# Patient Record
Sex: Female | Born: 1995 | Race: Black or African American | Hispanic: No
Health system: Southern US, Community
[De-identification: ages and names within clinical notes are randomized; demographics above are authoritative.]

---
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
## 2019-10-16 ENCOUNTER — Other Ambulatory Visit (HOSPITAL_COMMUNITY)
Admission: RE | Admit: 2019-10-16 | Discharge: 2019-10-16 | Disposition: A | Discharge status: Home / Self Care | Payer: BC Managed Care – PPO | Source: Ambulatory Visit | Attending: Family Medicine | Admitting: Family Medicine

## 2019-10-16 ENCOUNTER — Other Ambulatory Visit: Payer: Self-pay | Admitting: Family Medicine

## 2019-10-16 DIAGNOSIS — Z124 Encounter for screening for malignant neoplasm of cervix: Secondary | ICD-10-CM | POA: Insufficient documentation

## 2019-10-21 LAB — CYTOLOGY - PAP: Diagnosis: NEGATIVE

## 2021-01-31 DIAGNOSIS — Z03818 Encounter for observation for suspected exposure to other biological agents ruled out: Secondary | ICD-10-CM | POA: Diagnosis not present

## 2021-01-31 DIAGNOSIS — Z20822 Contact with and (suspected) exposure to covid-19: Secondary | ICD-10-CM | POA: Diagnosis not present

## 2021-02-09 DIAGNOSIS — Z20822 Contact with and (suspected) exposure to covid-19: Secondary | ICD-10-CM | POA: Diagnosis not present

## 2021-02-09 DIAGNOSIS — Z03818 Encounter for observation for suspected exposure to other biological agents ruled out: Secondary | ICD-10-CM | POA: Diagnosis not present

## 2021-02-10 ENCOUNTER — Telehealth: Payer: BC Managed Care – PPO | Admitting: Physician Assistant

## 2021-02-10 DIAGNOSIS — H1032 Unspecified acute conjunctivitis, left eye: Secondary | ICD-10-CM | POA: Diagnosis not present

## 2021-02-10 DIAGNOSIS — J020 Streptococcal pharyngitis: Secondary | ICD-10-CM | POA: Diagnosis not present

## 2021-02-10 MED ORDER — LIDOCAINE VISCOUS HCL 2 % MT SOLN: OROMUCOSAL | 0 refills | Status: AC

## 2021-02-10 MED ORDER — AMOXICILLIN 500 MG PO CAPS: 500.0000 mg | ORAL_CAPSULE | Freq: Two times a day (BID) | ORAL | 0 refills | Status: AC

## 2021-02-10 MED ORDER — POLYMYXIN B-TRIMETHOPRIM 10000-0.1 UNIT/ML-% OP SOLN: 1.0000 [drp] | OPHTHALMIC | 0 refills | Status: AC

## 2021-02-10 NOTE — Patient Instructions (Signed)
Janet Escobar, thank you for joining Margaretann Loveless, PA-C for today's virtual visit.  While this provider is not your primary care provider (PCP), if your PCP is located in our provider database this encounter information will be shared with them immediately following your visit.  Consent: (Patient) Janet Escobar provided verbal consent for this virtual visit at the beginning of the encounter.  Current Medications:  Current Outpatient Medications:    amoxicillin (AMOXIL) 500 MG capsule, Take 1 capsule (500 mg total) by mouth 2 (two) times daily for 10 days., Disp: 20 capsule, Rfl: 0   lidocaine (XYLOCAINE) 2 % solution, 65mL swish and swallow every 4 hours as needed for sore throat, Disp: 100 mL, Rfl: 0   trimethoprim-polymyxin b (POLYTRIM) ophthalmic solution, Place 1 drop into the left eye every 4 (four) hours. X 3-5 days, Disp: 10 mL, Rfl: 0   Medications ordered in this encounter:  Meds ordered this encounter  Medications   amoxicillin (AMOXIL) 500 MG capsule    Sig: Take 1 capsule (500 mg total) by mouth 2 (two) times daily for 10 days.    Dispense:  20 capsule    Refill:  0    Order Specific Question:   Supervising Provider    Answer:   MILLER, BRIAN [[id number]]   lidocaine (XYLOCAINE) 2 % solution    Sig: 86mL swish and swallow every 4 hours as needed for sore throat    Dispense:  100 mL    Refill:  0    Order Specific Question:   Supervising Provider    Answer:   Hyacinth Meeker, BRIAN [[id number]]   trimethoprim-polymyxin b (POLYTRIM) ophthalmic solution    Sig: Place 1 drop into the left eye every 4 (four) hours. X 3-5 days    Dispense:  10 mL    Refill:  0    Order Specific Question:   Supervising Provider    Answer:   Hyacinth Meeker, BRIAN [[id number]]     *If you need refills on other medications prior to your next appointment, please contact your pharmacy*  Follow-Up: Call back or seek an in-person evaluation if the symptoms worsen or if the condition fails to improve as  anticipated.  Other Instructions Strep Throat, Adult Strep throat is an infection in the throat that is caused by bacteria. It is common during the cold months of the year. It mostly affects children who are 14-55 years old. However, people of all ages can get it at any time of the year. This infection spreads from person to person (is contagious) through coughing, sneezing, or having close contact. Your health care provider may use other names to describe the infection. When strep throat affects the tonsils, it is called tonsillitis. When it affects the back of the throat, it is called pharyngitis. What are the causes? This condition is caused by the Streptococcus pyogenes bacteria. What increases the risk? You are more likely to develop this condition if: You care for school-age children, or are around school-age children. Children are more likely to get strep throat and may spread it to others. You spend time in crowded places where the infection can spread easily. You have close contact with someone who has strep throat. What are the signs or symptoms? Symptoms of this condition include: Fever or chills. Redness, swelling, or pain in the tonsils or throat. Pain or difficulty when swallowing. White or yellow spots on the tonsils or throat. Tender glands in the neck and under the jaw. Bad smelling breath. Red  rash all over the body. This is rare. How is this diagnosed? This condition is diagnosed by tests that check for the presence and the amount of bacteria that cause strep throat. They are: Rapid strep test. Your throat is swabbed and checked for the presence of bacteria. Results are usually ready in minutes. Throat culture test. Your throat is swabbed. The sample is placed in a cup that allows infections to grow. Results are usually ready in 1 or 2 days. How is this treated? This condition may be treated with: Medicines that kill germs (antibiotics). Medicines that relieve pain or  fever. These include: Ibuprofen or acetaminophen. Aspirin, only for people who are over the age of 24. Throat lozenges. Throat sprays. Follow these instructions at home: Medicines  Take over-the-counter and prescription medicines only as told by your health care provider. Take your antibiotic medicine as told by your health care provider. Do not stop taking the antibiotic even if you start to feel better. Eating and drinking  If you have trouble swallowing, try eating soft foods until your sore throat feels better. Drink enough fluid to keep your urine pale yellow. To help relieve pain, you may have: Warm fluids, such as soup and tea. Cold fluids, such as frozen desserts or popsicles. General instructions Gargle with a salt-water mixture 3-4 times a day or as needed. To make a salt-water mixture, completely dissolve -1 tsp (3-6 g) of salt in 1 cup (237 mL) of warm water. Get plenty of rest. Stay home from work or school until you have been taking antibiotics for 24 hours. Do not use any products that contain nicotine or tobacco. These products include cigarettes, chewing tobacco, and vaping devices, such as e-cigarettes. If you need help quitting, ask your health care provider. It is up to you to get your test results. Ask your health care provider, or the department that is doing the test, when your results will be ready. Keep all follow-up visits. This is important. How is this prevented?  Do not share food, drinking cups, or personal items that could cause the infection to spread to other people. Wash your hands often with soap and water for at least 20 seconds. If soap and water are not available, use hand sanitizer. Make sure that all people in your house wash their hands well. Have family members tested if they have a sore throat or fever. They may need an antibiotic if they have strep throat. Contact a health care provider if: You have swelling in your neck that keeps getting  bigger. You develop a rash, cough, or earache. You cough up a thick mucus that is green, yellow-brown, or bloody. You have pain or discomfort that does not get better with medicine. Your symptoms seem to be getting worse. You have a fever. Get help right away if: You have new symptoms, such as vomiting, severe headache, stiff or painful neck, chest pain, or shortness of breath. You have severe throat pain, drooling, or changes in your voice. You have swelling of the neck, or the skin on the neck becomes red and tender. You have signs of dehydration, such as tiredness (fatigue), dry mouth, and decreased urination. You become increasingly sleepy, or you cannot wake up completely. Your joints become red or painful. These symptoms may represent a serious problem that is an emergency. Do not wait to see if the symptoms will go away. Get medical help right away. Call your local emergency services (911 in the [place] Do not drive  yourself to the hospital. Summary Strep throat is an infection in the throat that is caused by the Streptococcus pyogenes bacteria. This infection is spread from person to person (is contagious) through coughing, sneezing, or having close contact. Take your medicines, including antibiotics, as told by your health care provider. Do not stop taking the antibiotic even if you start to feel better. To prevent the spread of germs, wash your hands well with soap and water. Have others do the same. Do not share food, drinking cups, or personal items. Get help right away if you have new symptoms, such as vomiting, severe headache, stiff or painful neck, chest pain, or shortness of breath. This information is not intended to replace advice given to you by your health care provider. Make sure you discuss any questions you have with your health care provider. Document Revised: 05/10/2020 Document Reviewed: 05/10/2020 Elsevier Patient Education  2022 [employer].    If you have been  instructed to have an in-person evaluation today at a local Urgent Care facility, please use the link below. It will take you to a list of all of our available [facility], including address, phone number and hours of operation. Please do not delay care.  [facility]  If you or a family member do not have a primary care provider, use the link below to schedule a visit and establish care. When you choose a [facility] primary care physician or advanced practice provider, you gain a long-term partner in health. Find a Primary Care Provider  Learn more about [facility] in-office and virtual care options: [facility] - Get Care Now

## 2021-02-10 NOTE — Progress Notes (Signed)
Virtual Visit Consent   Janet Escobar, you are scheduled for a virtual visit with a [facility] provider today.     Just as with appointments in the office, your consent must be obtained to participate.  Your consent will be active for this visit and any virtual visit you may have with one of our providers in the next 365 days.     If you have a MyChart account, a copy of this consent can be sent to you electronically.  All virtual visits are billed to your insurance company just like a traditional visit in the office.    As this is a virtual visit, video technology does not allow for your provider to perform a traditional examination.  This may limit your provider's ability to fully assess your condition.  If your provider identifies any concerns that need to be evaluated in person or the need to arrange testing (such as labs, EKG, etc.), we will make arrangements to do so.     Although advances in technology are sophisticated, we cannot ensure that it will always work on either your end or our end.  If the connection with a video visit is poor, the visit may have to be switched to a telephone visit.  With either a video or telephone visit, we are not always able to ensure that we have a secure connection.     I need to obtain your verbal consent now.   Are you willing to proceed with your visit today?    Janet Escobar has provided verbal consent on 02/10/2021 for a virtual visit (video or telephone).   Margaretann Loveless, PA-C   Date: 02/10/2021 9:51 AM   Virtual Visit via Video Note   I, Margaretann Loveless, connected with  Janet Escobar  ([id number], May 04, 1995) on 02/10/21 at  9:45 AM EST by a video-enabled telemedicine application and verified that I am speaking with the correct person using two identifiers.  Location: Patient: Virtual Visit Location Patient: Home Provider: Virtual Visit Location Provider: Home Office   I discussed the limitations of evaluation and management by  telemedicine and the availability of in person appointments. The patient expressed understanding and agreed to proceed.    History of Present Illness: Janet Escobar is a 26 y.o. who identifies as a female who was assigned female at birth, and is being seen today for sore throat.  HPI: Sore Throat  This is a new problem. Episode onset: Had simple URI that started on New Years that lasted about a week or more, was feeling better; traveled last week and returned Monday. Symptoms started Wednesday. The problem has been gradually worsening. There has been no fever. Associated symptoms include congestion, coughing, headaches, swollen glands and trouble swallowing (hurts to swallow; able to tolerate liquids). Pertinent negatives include no hoarse voice. Associated symptoms comments: Left eye pink and draining, loss of taste but is intermittent. She has had no exposure to strep or mono. She has tried acetaminophen and cool liquids for the symptoms. The treatment provided no relief.   Covid testing is negative  Problems: There are no problems to display for this patient.   Allergies: No Known Allergies Medications:  Current Outpatient Medications:    amoxicillin (AMOXIL) 500 MG capsule, Take 1 capsule (500 mg total) by mouth 2 (two) times daily for 10 days., Disp: 20 capsule, Rfl: 0   lidocaine (XYLOCAINE) 2 % solution, 21mL swish and swallow every 4 hours as needed for sore throat, Disp: 100 mL,  Rfl: 0   trimethoprim-polymyxin b (POLYTRIM) ophthalmic solution, Place 1 drop into the left eye every 4 (four) hours. X 3-5 days, Disp: 10 mL, Rfl: 0  Observations/Objective: Patient is well-developed, well-nourished in no acute distress.  Resting comfortably at home.  Head is normocephalic, atraumatic.  No labored breathing.  Speech is clear and coherent with logical content.  Patient is alert and oriented at baseline.    Assessment and Plan: 1. Strep pharyngitis - amoxicillin (AMOXIL) 500 MG capsule;  Take 1 capsule (500 mg total) by mouth 2 (two) times daily for 10 days.  Dispense: 20 capsule; Refill: 0 - lidocaine (XYLOCAINE) 2 % solution; 69mL swish and swallow every 4 hours as needed for sore throat  Dispense: 100 mL; Refill: 0  2. Acute bacterial conjunctivitis of left eye - trimethoprim-polymyxin b (POLYTRIM) ophthalmic solution; Place 1 drop into the left eye every 4 (four) hours. X 3-5 days  Dispense: 10 mL; Refill: 0  - Possible strep - Will treat with Amoxicillin and viscous lidocaine - Polytrim for conjunctivitis of the left eye - Warm liquids - Salt water gargles - Rest - Seek in person evaluation if symptoms fail to improve or worsen  Follow Up Instructions: I discussed the assessment and treatment plan with the patient. The patient was provided an opportunity to ask questions and all were answered. The patient agreed with the plan and demonstrated an understanding of the instructions.  A copy of instructions were sent to the patient via MyChart unless otherwise noted below.   The patient was advised to call back or seek an in-person evaluation if the symptoms worsen or if the condition fails to improve as anticipated.  Time:  I spent 12 minutes with the patient via telehealth technology discussing the above problems/concerns.    Margaretann Loveless, PA-C

## 2021-06-26 DIAGNOSIS — Z7251 High risk heterosexual behavior: Secondary | ICD-10-CM | POA: Diagnosis not present

## 2021-10-25 DIAGNOSIS — Z23 Encounter for immunization: Secondary | ICD-10-CM | POA: Diagnosis not present

## 2021-10-25 DIAGNOSIS — Z113 Encounter for screening for infections with a predominantly sexual mode of transmission: Secondary | ICD-10-CM | POA: Diagnosis not present

## 2021-10-25 DIAGNOSIS — Z131 Encounter for screening for diabetes mellitus: Secondary | ICD-10-CM | POA: Diagnosis not present

## 2021-10-25 DIAGNOSIS — Z13220 Encounter for screening for lipoid disorders: Secondary | ICD-10-CM | POA: Diagnosis not present

## 2021-10-25 DIAGNOSIS — J309 Allergic rhinitis, unspecified: Secondary | ICD-10-CM | POA: Diagnosis not present

## 2021-10-25 DIAGNOSIS — Z0000 Encounter for general adult medical examination without abnormal findings: Secondary | ICD-10-CM | POA: Diagnosis not present

## 2022-11-22 DIAGNOSIS — Z113 Encounter for screening for infections with a predominantly sexual mode of transmission: Secondary | ICD-10-CM | POA: Diagnosis not present

## 2022-11-22 DIAGNOSIS — Z23 Encounter for immunization: Secondary | ICD-10-CM | POA: Diagnosis not present

## 2022-11-22 DIAGNOSIS — E7800 Pure hypercholesterolemia, unspecified: Secondary | ICD-10-CM | POA: Diagnosis not present

## 2022-11-22 DIAGNOSIS — Z0000 Encounter for general adult medical examination without abnormal findings: Secondary | ICD-10-CM | POA: Diagnosis not present

## 2022-11-22 DIAGNOSIS — D649 Anemia, unspecified: Secondary | ICD-10-CM | POA: Diagnosis not present

## 2022-11-22 DIAGNOSIS — E010 Iodine-deficiency related diffuse (endemic) goiter: Secondary | ICD-10-CM | POA: Diagnosis not present
